# Patient Record
Sex: Male | Born: 1940 | Race: White | Hispanic: No | Marital: Single
Health system: Southern US, Community
[De-identification: ages and names within clinical notes are randomized; demographics above are authoritative.]

---

## 2005-05-16 ENCOUNTER — Emergency Department: Payer: Self-pay | Admitting: Unknown Physician Specialty

## 2010-11-22 ENCOUNTER — Ambulatory Visit: Payer: Self-pay | Admitting: Internal Medicine

## 2011-01-13 ENCOUNTER — Ambulatory Visit: Payer: Self-pay | Admitting: Internal Medicine

## 2011-01-14 ENCOUNTER — Inpatient Hospital Stay: Payer: Self-pay | Admitting: Internal Medicine

## 2012-02-14 ENCOUNTER — Ambulatory Visit: Payer: Self-pay | Admitting: Hematology & Oncology

## 2012-02-27 ENCOUNTER — Inpatient Hospital Stay: Payer: Self-pay | Admitting: Internal Medicine

## 2012-02-27 LAB — TROPONIN I: Troponin-I: <0.02 ng/mL

## 2012-02-27 LAB — CBC
HGB: 13 g/dL (ref 13.0–18.0)
MCH: 38.7 pg — ABNORMAL HIGH (ref 26.0–34.0)
MCV: 114 fL — ABNORMAL HIGH (ref 80–100)
Platelet: 66 10*3/uL — ABNORMAL LOW (ref 150–440)
RBC: 3.35 10*6/uL — ABNORMAL LOW (ref 4.40–5.90)

## 2012-02-27 LAB — COMPREHENSIVE METABOLIC PANEL
Albumin: 3.4 g/dL (ref 3.4–5.0)
Alkaline Phosphatase: 64 U/L (ref 50–136)
BUN: 24 mg/dL — ABNORMAL HIGH (ref 7–18)
Bilirubin,Total: 0.6 mg/dL (ref 0.2–1.0)
Chloride: 86 mmol/L — ABNORMAL LOW (ref 98–107)
Co2: 32 mmol/L (ref 21–32)
Creatinine: 1.07 mg/dL (ref 0.60–1.30)
EGFR (African American): >60
EGFR (Non-African Amer.): >60
Glucose: 407 mg/dL — ABNORMAL HIGH (ref 65–99)
Osmolality: 282 (ref 275–301)
Potassium: 2.8 mmol/L — ABNORMAL LOW (ref 3.5–5.1)
SGOT(AST): 62 U/L — ABNORMAL HIGH (ref 15–37)
SGPT (ALT): 179 U/L — ABNORMAL HIGH

## 2012-02-27 LAB — URINALYSIS, COMPLETE
Bilirubin,UR: NEGATIVE
Glucose,UR: 150 mg/dL (ref 0–75)
Hyaline Cast: 4
Ketone: NEGATIVE
Nitrite: NEGATIVE
Ph: 6 (ref 4.5–8.0)
Protein: NEGATIVE
Specific Gravity: 1.015 (ref 1.003–1.030)
WBC UR: <1 /HPF (ref 0–5)

## 2012-02-27 LAB — APTT: Activated PTT: 24.1 secs (ref 23.6–35.9)

## 2012-02-28 LAB — CBC WITH DIFFERENTIAL/PLATELET
Basophil #: 0 10*3/uL (ref 0.0–0.1)
Eosinophil #: 0 10*3/uL (ref 0.0–0.7)
HCT: 38.5 % — ABNORMAL LOW (ref 40.0–52.0)
HGB: 13.1 g/dL (ref 13.0–18.0)
Lymphocyte %: 31.6 %
MCHC: 34.1 g/dL (ref 32.0–36.0)
Monocyte #: 0.2 x10 3/mm (ref 0.2–1.0)
Monocyte %: 3.8 %
Neutrophil %: 63.8 %
Platelet: 70 10*3/uL — ABNORMAL LOW (ref 150–440)
RBC: 3.34 10*6/uL — ABNORMAL LOW (ref 4.40–5.90)
WBC: 5.9 10*3/uL (ref 3.8–10.6)

## 2012-02-28 LAB — COMPREHENSIVE METABOLIC PANEL
Alkaline Phosphatase: 64 U/L (ref 50–136)
BUN: 25 mg/dL — ABNORMAL HIGH (ref 7–18)
Bilirubin,Total: 0.5 mg/dL (ref 0.2–1.0)
Chloride: 95 mmol/L — ABNORMAL LOW (ref 98–107)
Co2: 32 mmol/L (ref 21–32)
Creatinine: 0.88 mg/dL (ref 0.60–1.30)
EGFR (African American): >60
EGFR (Non-African Amer.): >60
SGOT(AST): 51 U/L — ABNORMAL HIGH (ref 15–37)
SGPT (ALT): 163 U/L — ABNORMAL HIGH
Total Protein: 7.2 g/dL (ref 6.4–8.2)

## 2012-02-28 LAB — HEMOGLOBIN A1C: Hemoglobin A1C: 8.6 % — ABNORMAL HIGH (ref 4.2–6.3)

## 2012-02-29 LAB — HEPATIC FUNCTION PANEL A (ARMC)
Albumin: 3.2 g/dL — ABNORMAL LOW (ref 3.4–5.0)
Alkaline Phosphatase: 62 U/L (ref 50–136)
Bilirubin,Total: 0.5 mg/dL (ref 0.2–1.0)
SGOT(AST): 57 U/L — ABNORMAL HIGH (ref 15–37)
Total Protein: 7.1 g/dL (ref 6.4–8.2)

## 2012-03-01 LAB — CBC WITH DIFFERENTIAL/PLATELET
Basophil %: 0.6 %
Eosinophil %: 0.4 %
HGB: 14 g/dL (ref 13.0–18.0)
Lymphocyte %: 47.8 %
MCHC: 34.8 g/dL (ref 32.0–36.0)
MCV: 111 fL — ABNORMAL HIGH (ref 80–100)
Neutrophil #: 2.9 10*3/uL (ref 1.4–6.5)
Neutrophil %: 47.3 %
RDW: 15.8 % — ABNORMAL HIGH (ref 11.5–14.5)
WBC: 6.2 10*3/uL (ref 3.8–10.6)

## 2012-03-01 LAB — PROTIME-INR: Prothrombin Time: 12.3 secs (ref 11.5–14.7)

## 2012-03-02 LAB — CBC WITH DIFFERENTIAL/PLATELET
Eosinophil #: 0 10*3/uL (ref 0.0–0.7)
Eosinophil %: 0.4 %
HCT: 39.3 % — ABNORMAL LOW (ref 40.0–52.0)
HGB: 13.5 g/dL (ref 13.0–18.0)
Lymphocyte #: 2.3 10*3/uL (ref 1.0–3.6)
Lymphocyte %: 47.5 %
MCH: 38.4 pg — ABNORMAL HIGH (ref 26.0–34.0)
MCHC: 34.2 g/dL (ref 32.0–36.0)
MCV: 112 fL — ABNORMAL HIGH (ref 80–100)
Monocyte %: 3.9 %
Neutrophil #: 2.3 10*3/uL (ref 1.4–6.5)
Platelet: 82 10*3/uL — ABNORMAL LOW (ref 150–440)
RBC: 3.5 10*6/uL — ABNORMAL LOW (ref 4.40–5.90)
RDW: 15.4 % — ABNORMAL HIGH (ref 11.5–14.5)
WBC: 4.8 10*3/uL (ref 3.8–10.6)

## 2012-03-02 LAB — BASIC METABOLIC PANEL
BUN: 13 mg/dL (ref 7–18)
Calcium, Total: 9 mg/dL (ref 8.5–10.1)
Chloride: 90 mmol/L — ABNORMAL LOW (ref 98–107)
EGFR (African American): >60
EGFR (Non-African Amer.): >60
Potassium: 2.4 mmol/L — CL (ref 3.5–5.1)

## 2012-03-02 LAB — PROTIME-INR: Prothrombin Time: 13 secs (ref 11.5–14.7)

## 2012-03-02 LAB — HEPATIC FUNCTION PANEL A (ARMC)
Albumin: 3.2 g/dL — ABNORMAL LOW (ref 3.4–5.0)
Alkaline Phosphatase: 61 U/L (ref 50–136)
Bilirubin, Direct: 0.1 mg/dL (ref 0.00–0.20)
Bilirubin,Total: 0.4 mg/dL (ref 0.2–1.0)
SGPT (ALT): 136 U/L — ABNORMAL HIGH
Total Protein: 7.2 g/dL (ref 6.4–8.2)

## 2012-03-03 LAB — BASIC METABOLIC PANEL WITH GFR
Anion Gap: 13
BUN: 20 mg/dL — ABNORMAL HIGH
Calcium, Total: 9.3 mg/dL
Chloride: 92 mmol/L — ABNORMAL LOW
Co2: 31 mmol/L
Creatinine: 0.79 mg/dL
EGFR (African American): >60
EGFR (Non-African Amer.): >60
Glucose: 210 mg/dL — ABNORMAL HIGH
Osmolality: 281
Potassium: 2.8 mmol/L — ABNORMAL LOW
Sodium: 136 mmol/L

## 2012-03-03 LAB — PLATELET COUNT: Platelet: 99 10*3/uL — ABNORMAL LOW (ref 150–440)

## 2012-03-03 LAB — PROTIME-INR
INR: 1
Prothrombin Time: 13.1 secs (ref 11.5–14.7)

## 2012-03-03 LAB — HEPATIC FUNCTION PANEL A (ARMC)
Albumin: 3.2 g/dL — ABNORMAL LOW
Alkaline Phosphatase: 76 U/L
Bilirubin, Direct: <0.1 mg/dL
Bilirubin,Total: 0.5 mg/dL
SGOT(AST): 135 U/L — ABNORMAL HIGH
SGPT (ALT): 211 U/L — ABNORMAL HIGH
Total Protein: 7.4 g/dL

## 2012-03-15 ENCOUNTER — Ambulatory Visit: Payer: Self-pay | Admitting: Hematology & Oncology

## 2012-06-15 DEATH — deceased

## 2012-11-17 IMAGING — US ABDOMEN ULTRASOUND
1 series · 14 of 25 positions shown · non-contrast
Comparison: none

REASON FOR EXAM: abnormal LFTs
COMMENTS:

[Series 1: abdomen ultrasound · 0.36mm/px · 14 of 83 slices shown]
[im 1/83]
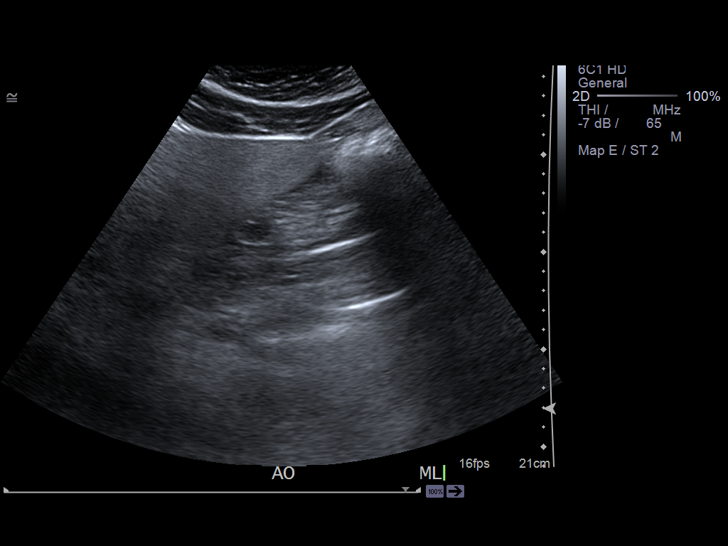
[im 7/83]
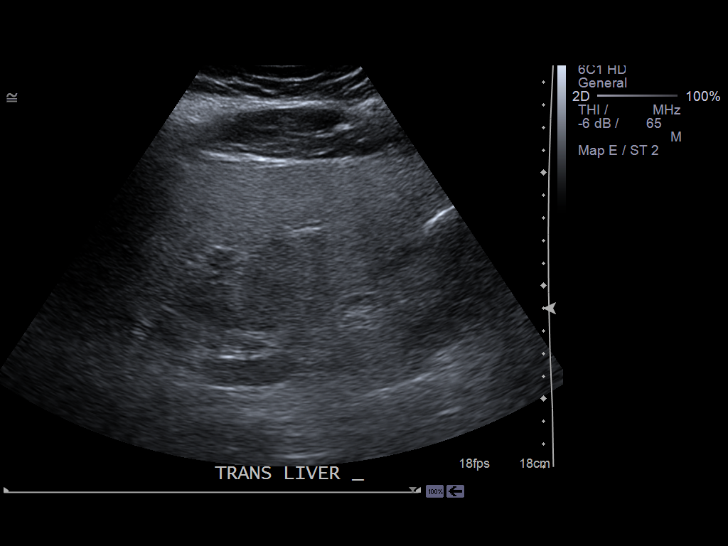
[im 14/83]
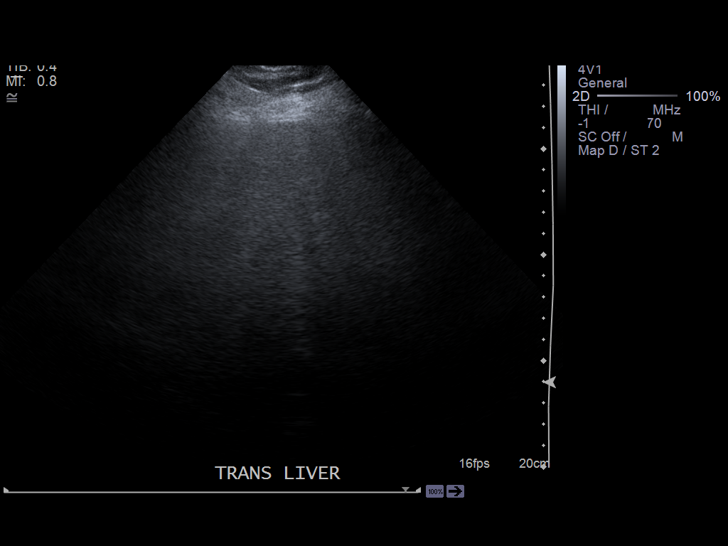
[im 21/83]
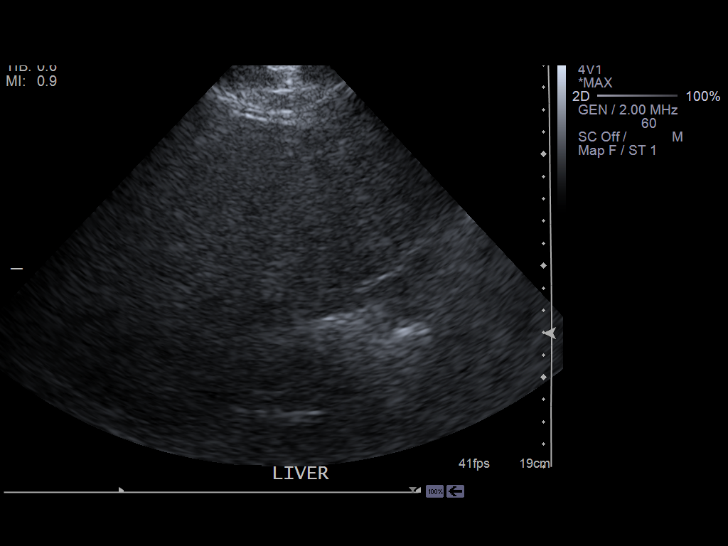
[im 28/83]
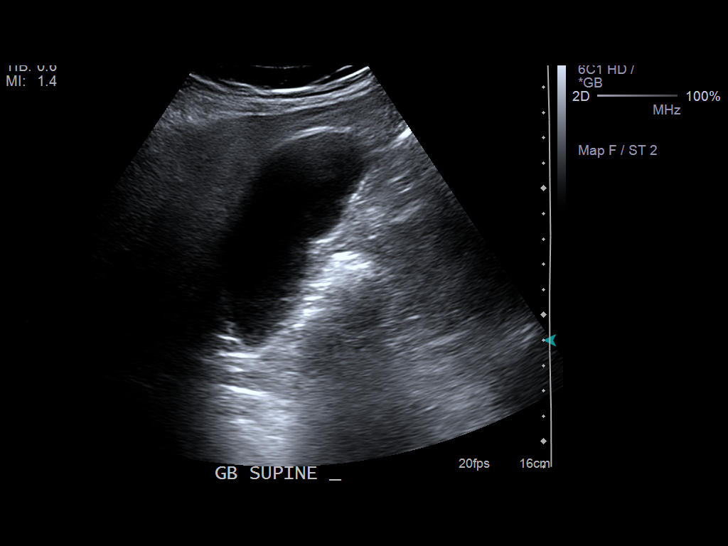
[im 31/83]
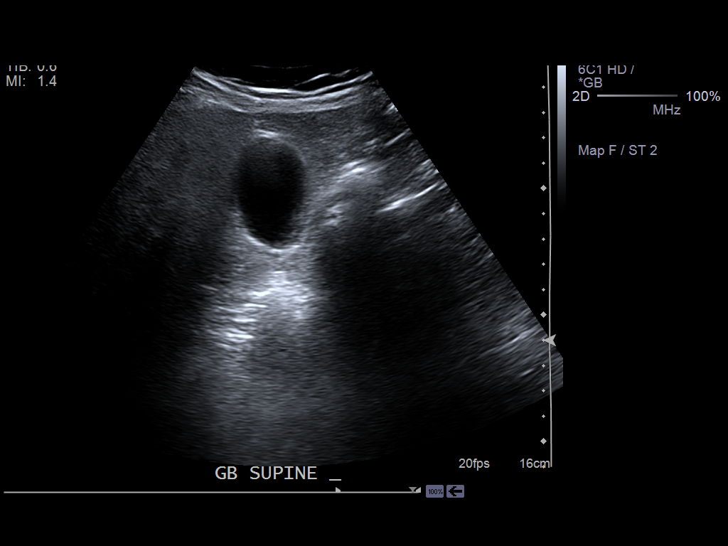
[im 38/83]
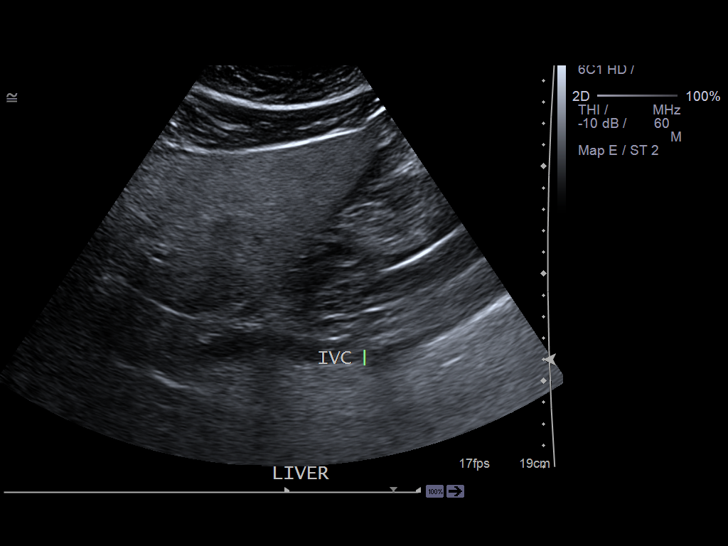
[im 45/83]
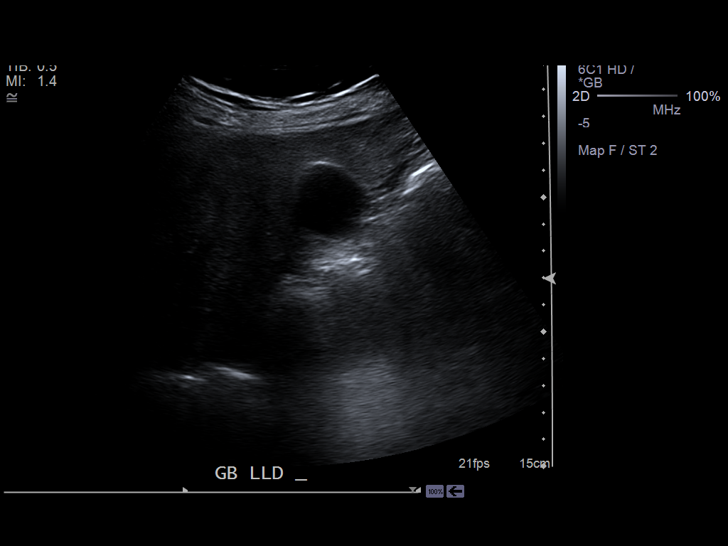
[im 52/83]
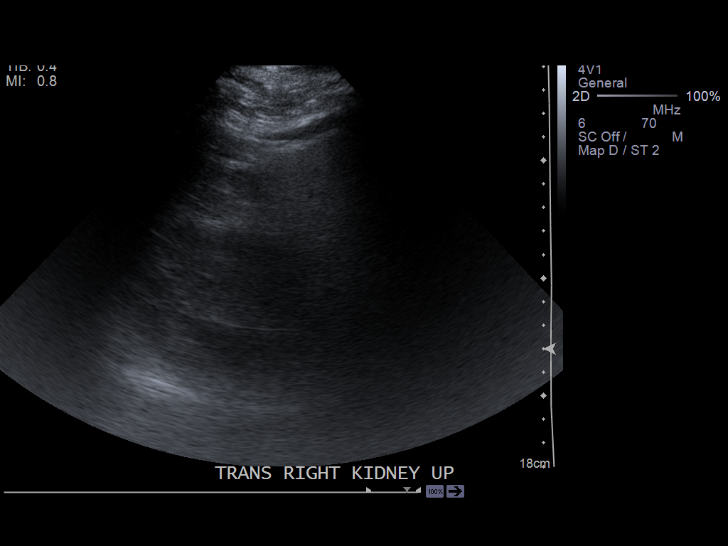
[im 55/83]
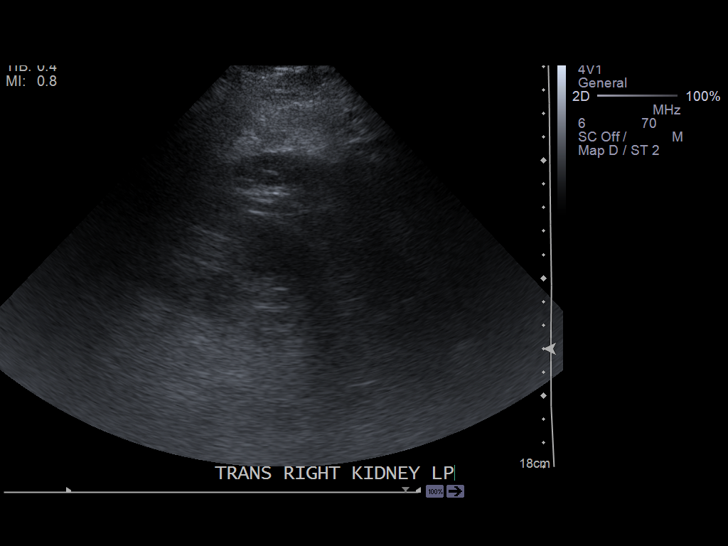
[im 62/83]
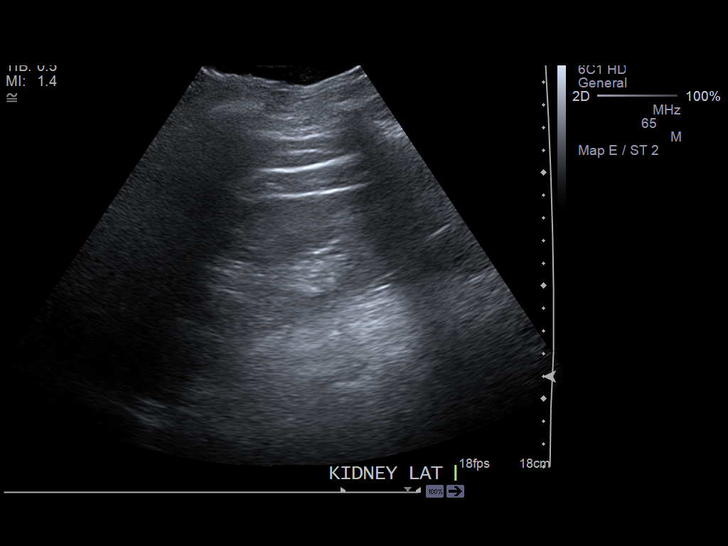
[im 69/83]
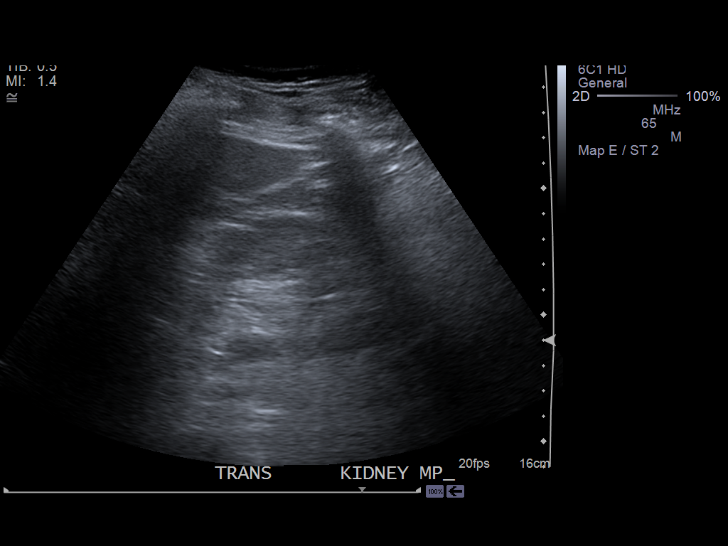
[im 76/83]
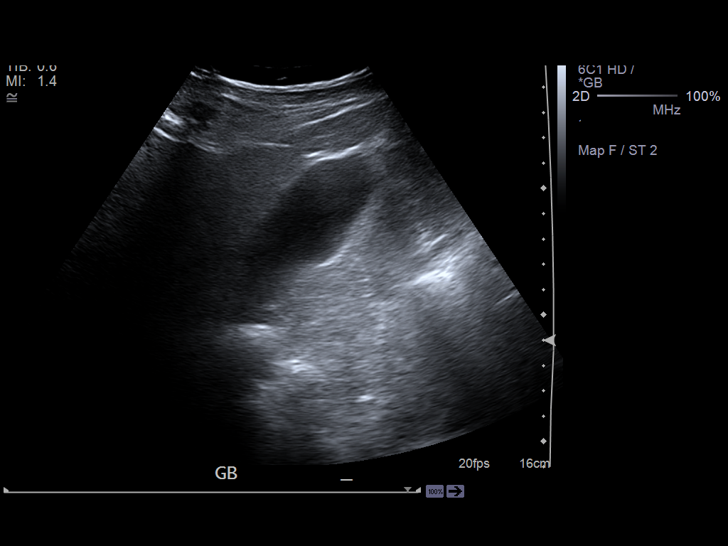
[im 83/83]
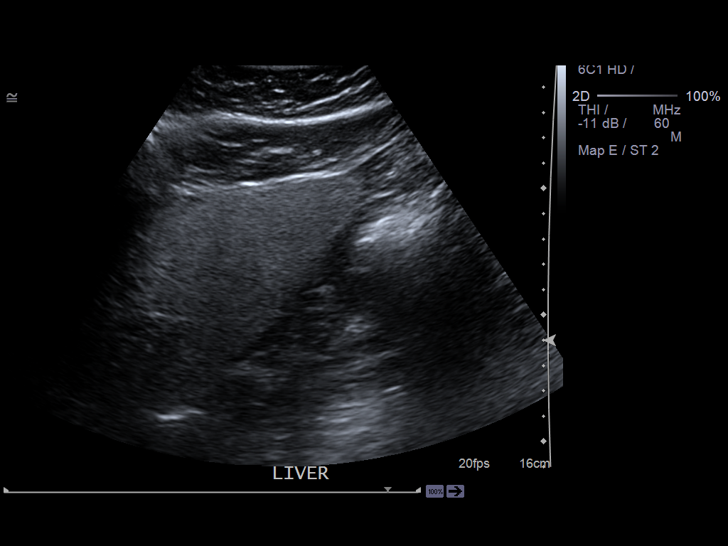

[14 of 25 positions shown; findings below may reference images not displayed]

PROCEDURE:     US  - US ABDOMEN GENERAL SURVEY  - February 29, 2012  [DATE]

RESULT:     The liver exhibits increased echotexture suggesting fatty
infiltrative change. There is no focal mass or ductal dilation. Portal
venous flow is normal in direction toward the liver. The gallbladder is
adequately distended with no evidence of stones, wall thickening, or
pericholecystic fluid. There is no positive sonographic Murphy's sign.

Bowel gas obscured the common bile duct as well as the pancreas and
abdominal aorta. The spleen, inferior vena cava, and kidneys are normal in
appearance.
IMPRESSION: 1. There are changes within the hepatic echotexture consistent with fatty
infiltration. There is no focal mass or ductal dilation.
2. The gallbladder exhibits no evidence of stones.
3. The patient's body habitus as well as the presence of bowel gas limited
evaluation of the pancreas, common bile duct, and abdominal aorta.

[REDACTED]

## 2012-11-18 IMAGING — CT CT CHEST W/ CM
2 series · 15 of 31 positions shown, 19 images · non-contrast
Comparison: none

REASON FOR EXAM: tachycardia, Afib, DVT, ?PE
COMMENTS:

[Series 5: soft tissue · axial · 0.93mm/px · z∈[-666,-618]mm · 2 of 101 slices shown]
[im 8/101  mediastinal]
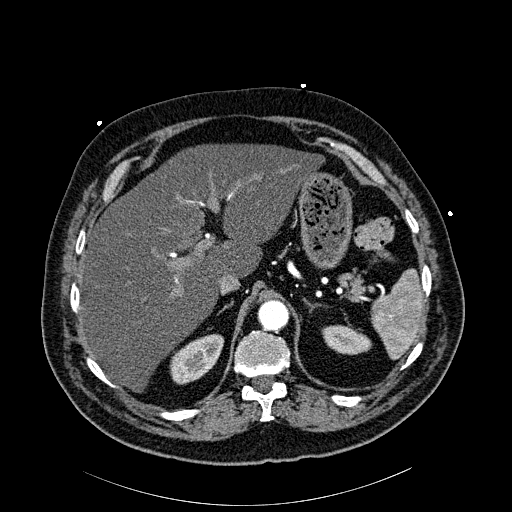
[im 24/101  mediastinal]
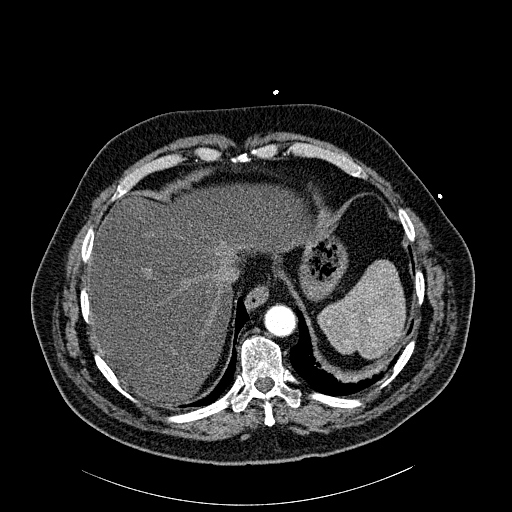

[Series 6: lung windows · axial · 0.93mm/px · z∈[-660,-411]mm · 13 of 99 slices shown, 17 images]
[im 8/99  mediastinal]
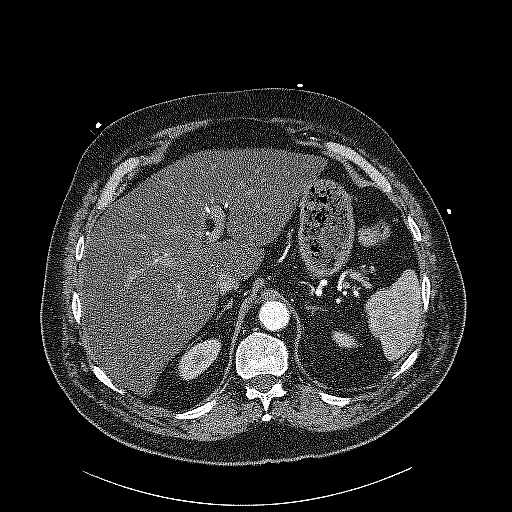
[im 8/99  lung]
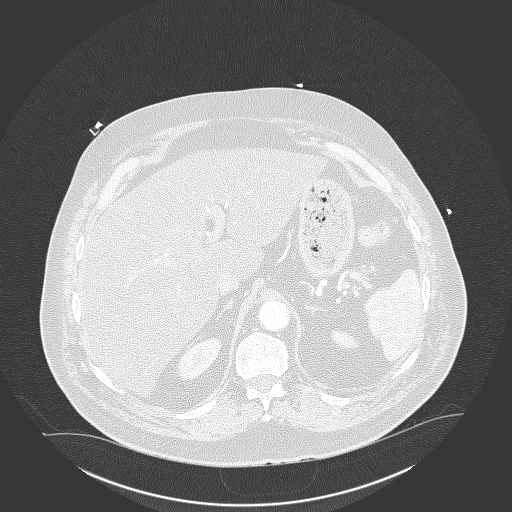
[im 16/99  lung]
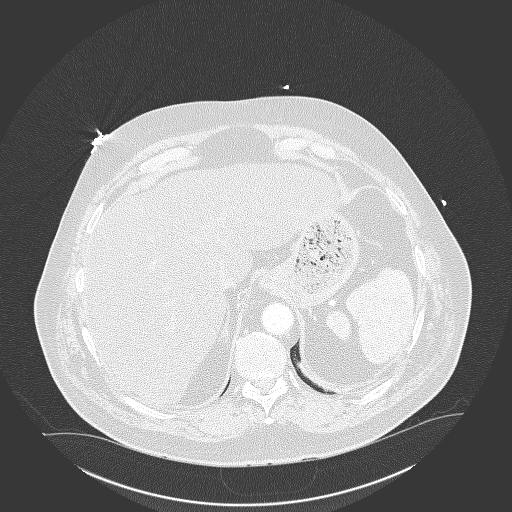
[im 23/99  lung]
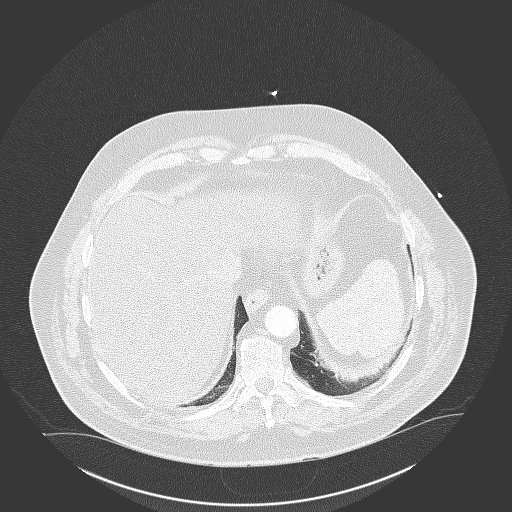
[im 31/99  lung]
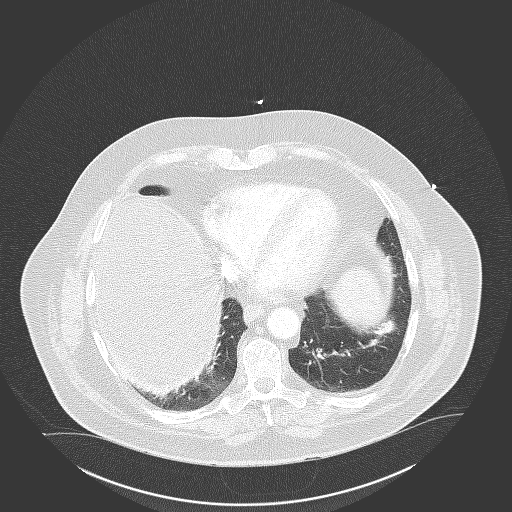
[im 38/99  mediastinal]
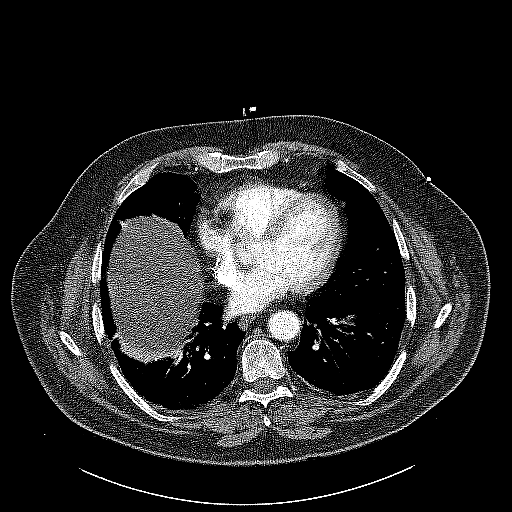
[im 38/99  lung]
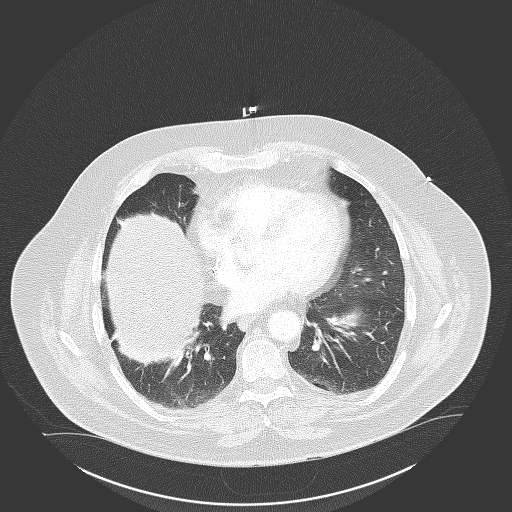
[im 46/99  lung]
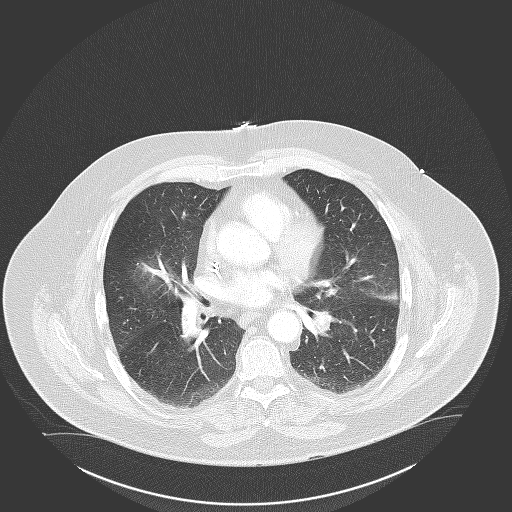
[im 50/99  lung]
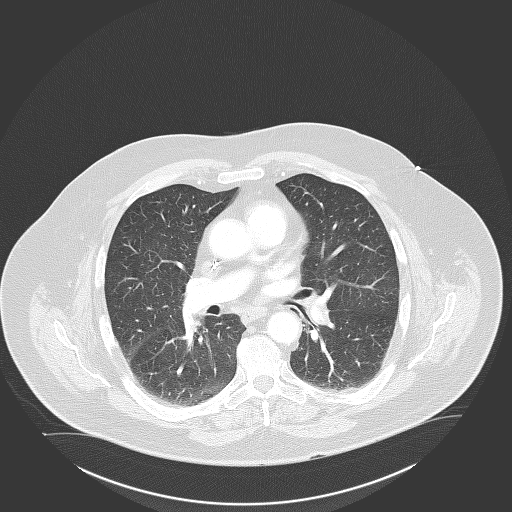
[im 53/99  lung]
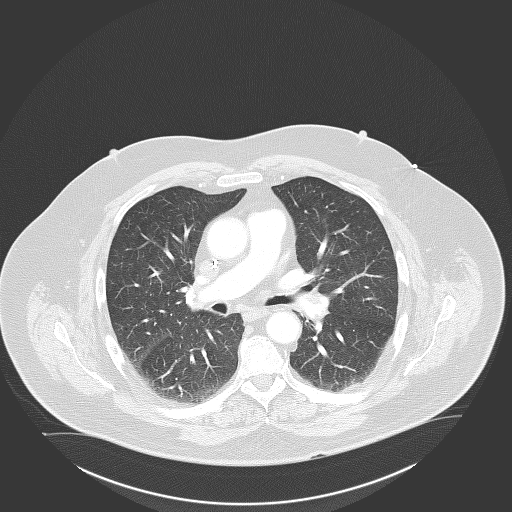
[im 61/99  mediastinal]
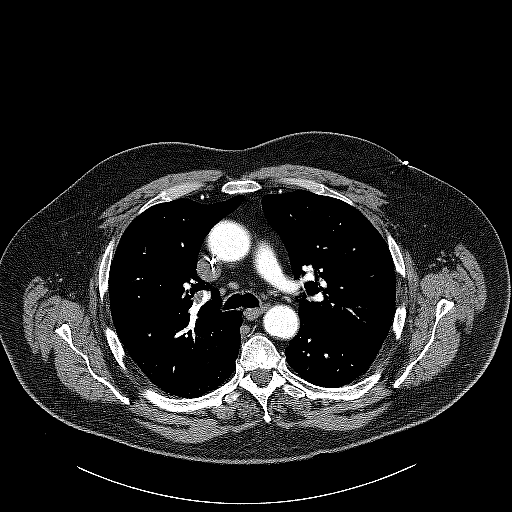
[im 61/99  lung]
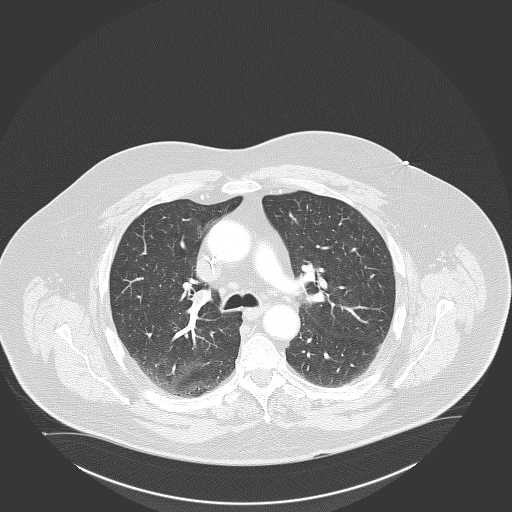
[im 68/99  lung]
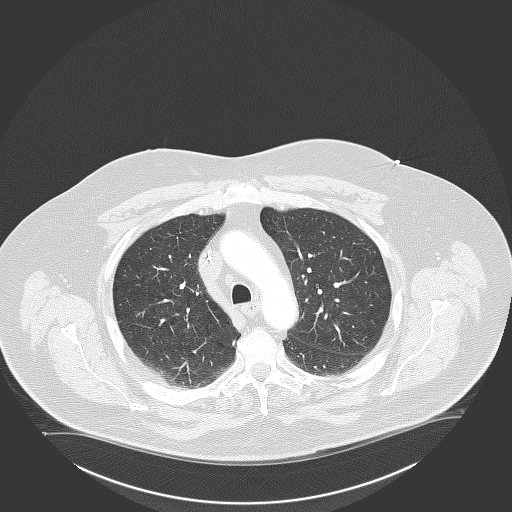
[im 76/99  lung]
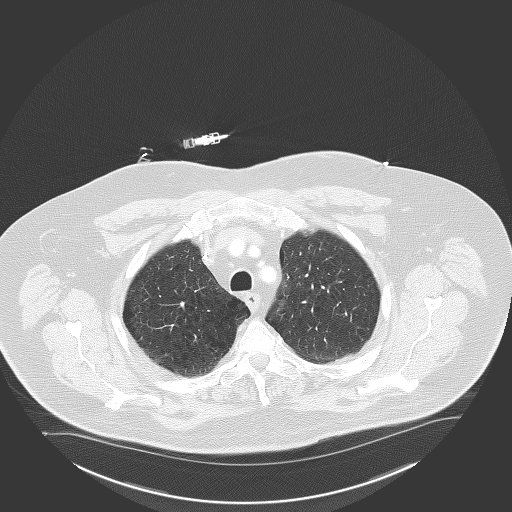
[im 83/99  lung]
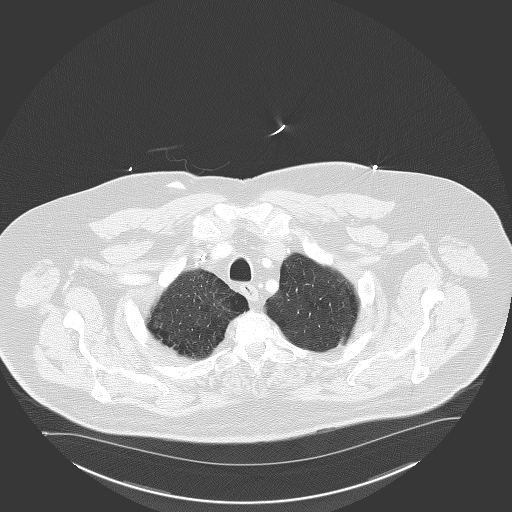
[im 91/99  mediastinal]
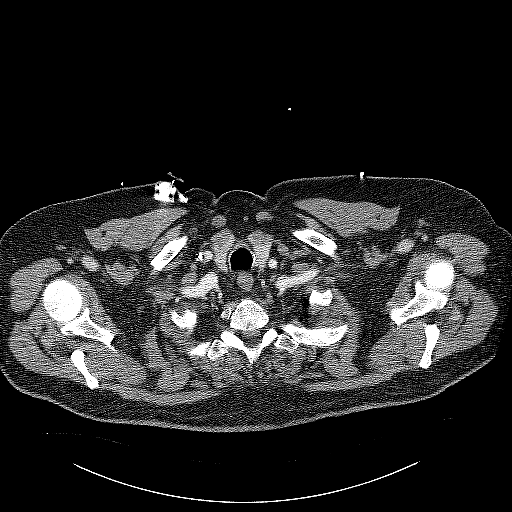
[im 91/99  lung]
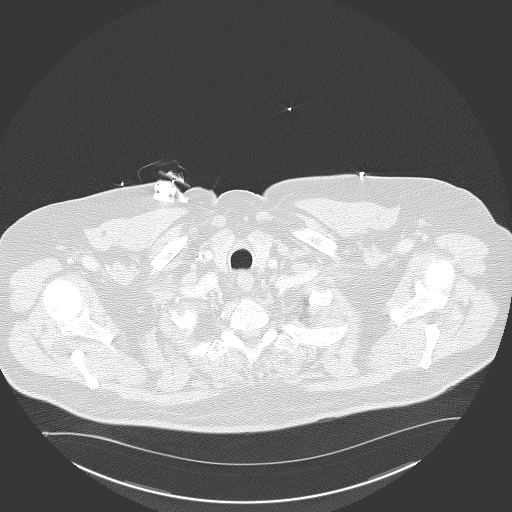

[15 of 31 positions shown; findings below may reference images not displayed]

PROCEDURE:     CT  - CT CHEST (FOR PE) W  - March 01, 2012  [DATE]

RESULT:     Axial CT scanning was performed through the chest with
reconstructions at 3 mm intervals and slice thicknesses. Review of
multiplanar reconstructed images was performed separately on the VIA
monitor.

There are multiple bilateral pulmonary emboli. These are seen in the
proximal branches of the right and left main pulmonary arteries. The cardiac
chambers are normal in size. The caliber of the thoracic aorta is normal. I
see no pleural nor pericardial effusion. No pathologic sized mediastinal or
hilar lymph nodes are evident. The thoracic esophagus does not appear
abnormal.

At lung window settings there are no findings to suggest a pulmonary
infarction. There is minimal bibasilar atelectasis. Within the upper abdomen
the liver exhibits decreased density diffusely consistent with fatty
infiltration. I see no adrenal mass. The thoracic vertebral bodies are
preserved in height.
IMPRESSION: 1. There are multiple bilateral pulmonary emboli. I do not see evidence of
pulmonary infarction.
2. There is no evidence of CHF nor of pneumonia. There is minimal bibasilar
atelectasis posteriorly.
3. I see no mediastinal nor hilar lymphadenopathy nor pleural nor
pericardial effusion.
4. There are fatty infiltrative changes of the liver.

This report was discussed by me by telephone with Dr. Josiel Alejandro at [DATE] p.m. on
01 March, 2012.(*)

## 2015-01-07 NOTE — Discharge Summary (Signed)
PATIENT NAME:  Bobby Curtis, Bobby Curtis#:  [id number] DATE OF BIRTH:  [birth year]  DATE OF ADMISSION:  02/27/2012 DATE OF DISCHARGE:  03/03/2012   PRIMARY CARE PHYSICIAN: Dr. Clayborn BignessKatherine Curtis  PR[facility]: [facility]   REASON FOR ADMISSION: Muscle cramps and pain in both legs.   DISCHARGE DIAGNOSES:  1. 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 2. Bilateral pulmonary emboli, as above due to deep vein thromboses and malignancy.  3. New onset atrial fibrillation with rapid ventricular response, likely from PE.  4. Thrombocytopenia with history of AML.  5. History of AML status post  completed chemotherapy. 6. History of hypertension.  7. History of diabetes mellitus.  8. History of chronic obstructive pulmonary disease.  9. Abnormal liver function tests with abdominal ultrasound revealing fatty liver disease.  10. History of cellulitis. 11. History of congestive heart failure.   CONSULTS:   1. Oncology, Dr. Maryln Curtis   2. Vascular surgery, Dr. Gilda Curtis  DISCHARGE DISPOSITION: Home with Hospice.   DISCHARGE MEDICATIONS:  1. Lovenox 1 mg/kg (120 mg) subq q. 12 hours until INR is therapeutic. 2. Coumadin 5 mg p.o. q. 24 hours. 3. Oxycodone 5 mg p.o. q. 8 hours p.r.n. pain.  4. Metolazone 2.5 mg p.o. daily.  5. Lasix 40 mg p.o. daily.  6. Metformin 1000 mg p.o. b.i.d. with meals. 7. Cardizem CD 120 mg p.o. daily.  8. Potassium chloride 40 mEq p.o. daily. 9. Haloperidol 0.5 mg p.o. b.i.d.  10. Lorazepam 0.5 mg p.o. b.i.d. 11. DuoNebs one dose inhaled t.i.d.  12. Dexamethasone 4 mg p.o. daily.   DISCHARGE CONDITION: Improved, stable.   DISCHARGE ACTIVITY: As tolerated.   DISCHARGE DIET: Low sodium, ADA, low fat, low cholesterol.   DISCHARGE INSTRUCTIONS:  1. Take medications as prescribed.  2. Return to the Emergency Department for recurrence of symptoms or for worsening chest pain or shortness of breath or any for any bleeding complications or  problems.  FOLLOWUP INSTRUCTIONS: 1. Follow up with Bobby Curtis within one week. The patient needs repeat PT/INR in the next two to three days.  Please instruct the patient to stop taking Lovenox once INR is therapeutic and reaches 2. ( Hospice nurse can draw PT/INR at the patient's home with results called or faxed to Bobby Curtis's office). The patient also needs repeat CBC, BMP, and liver function tests per Bobby Curtis in one week.  2. [facility] within 1 to 2 weeks. The patient needs repeat CBC within one week.  3. Resume home Hospice services.   REFERRALS: The patient will [facility] within 1 to 2 weeks for new onset atrial fibrillation.    PROCEDURES:  1. Bilateral lower extremity venous Doppler ultrasound 02/27/2012: On the right there is thrombus that is nearly occlusive in the popliteal vein. On the left there is near-occlusive thrombus seen in the distal aspect of the superficial femoral vein and there is occlusive thrombus in the popliteal vein.  2. Abdominal ultrasound 02/29/2012: Changes in hepatic echotexture consistent with fatty infiltration. No focal mass or ductal dilatation. The gallbladder exhibits no evidence of stones. The patient's body habitus as well as the presence of bowel gas limited evaluation of the pancreas, common bile duct, and abdominal aorta. 3. CT of the chest with PE protocol 03/01/2012: Multiple bilateral pulmonary emboli. No evidence of pulmonary infarction. No evidence of congestive heart failure or pneumonia. Minimal bibasilar  atelectasis posteriorly. No mediastinal or hilar lymphadenopathy. No pleural effusion or pericardial effusion. There are fatty infiltrative changes of the liver.   4. 2-D echocardiogram 02/28/2012: Left ventricular systolic function normal with ejection fraction 50%. The left atrium is mildly dilated, mild to moderate mitral regurgitation, mild  tricuspid regurgitation.   PERTINENT LABORATORY DATA:  EKG on admission: Atrial fibrillation with heart rate 106 beats per minute with nonspecific ST and T wave changes.   INR 1 at the time of admission and 1 on the day of discharge. CBC on admission: WBC 5.6, hemoglobin 13, hematocrit 38.3, platelets 66.  Platelet count 99 on the day of discharge. Troponins were negative on admission. LFTs on admission normal except for AST 62, ALT 179, AST 135, ALT 211 on the day of discharge. Potassium 2.8 on admission and 3 on the day of discharge. Hemoglobin A1c 8.6.   BRIEF HISTORY/HOSPITAL COURSE: The patient is a 74 year old male with past medical history of hypertension, diabetes mellitus, chronic obstructive pulmonary disease, AML status post completed chemotherapy with history of thrombocytopenia who presented to the Emergency Department with complaints of  bilateral leg pain and leg/muscle cramps. Please see dictated admission History and Physical for pertinent details surrounding the onset of this hospitalization. Please see below for further details. 1. Bilateral lower extremity deep vein thromboses: As noted on lower extremity Doppler ultrasound. This is felt to be due to his underlying malignancy as he had no periods of prolonged immobility. He was admitted to the hospital and placed on Lovenox and Coumadin and has responded well. He was advised lifelong anticoagulation as per oncologist's recommendations as  his venous thromboembolism was felt to be due to malignancy. He had atrial fibrillation with RVR and tachycardia, which is new onset, and a CT of the chest was obtained confirming bilateral pulmonary emboli, which is likely the cause of his atrial fibrillation. He has been started on calcium channel blocker for heart rate control and he is on anticoagulation for PE and deep vein thrombosis, which should also provide prophylaxis against stroke. He will be on lifelong anticoagulation. His known CHADS-2  score is 2. He has spontaneously converted to normal sinus rhythm. Otherwise, his atrial fibrillation is asymptomatic and he is without any chest pain, shortness of breath, or heart palpitations. In regards to bilateral pulmonary emboli and deep vein thromboses, as above he will be on lifelong anticoagulation and he has been educated in regards to self- administration of Lovenox and feels comfortable doing so and we will also continue Coumadin. Recommend repeat PT/INR in the next two to three days per the patient's primary care physician.  The patient should be instructed to stop taking Lovenox once his INR is therapeutic and should be given further instructions regarding his Coumadin dose. Vascular surgical consultation was also obtained and Dr. Gilda Crease feels that since the patient has clinically improved there is no definite indication for IVC filter at this time unless he fails anticoagulation, of which there has been no evidence at this time.  2. In regards to atrial fibrillation, the patient has spontaneously converted to sinus rhythm and he will be on Cardizem for heart rate control.  His atrial fibrillation was felt to be due to pulmonary emboli for which he will be on anticoagulation.  His atrial fibrillation seems to be asymptomatic at this time.  He will be referred to cardiology as an outpatient for further management and recommendations. 3. In regards to thrombocytopenia, Lovenox will need to be used cautiously but overall  his platelet count has improved and he has had no bleeding complications or problems during this hospitalization. Thrombocytopenia is likely due to his history of AML. Coumadin should not lower his platelet count.  He will need to continue to follow up with his oncologist at [facility] for further management of AML. 4. AML.  The patient was seen by our oncology team and was recommended to follow up with his oncologist at [facility] upon discharge. 5. In regards to abnormal liver  function tests, the patient was asymptomatic. He was noted to have fatty liver on abdominal ultrasound and chest CT. Hepatotoxins were avoided. A viral hepatitis screen was negative other than prior exposure to hepatitis A virus. The patient has been asymptomatic and denied any abdominal pain, nausea, or vomiting, or any fatigue or malaise. Since he is asymptomatic at this time, his abnormal liver function tests can further be followed and managed and worked up as an outpatient to which the patient was agreeable rather than having this worked up in the inpatient setting, as the patient was given the option but declined. His LFTs have somewhat worsened since admission in regards to his transaminases. Recommend repeat LFTs per the patient's primary care physician within the next week.  6. In regards to chronic obstructive pulmonary disease, this has remained stable and without acute exacerbation.  7. In regards to diabetes mellitus, this patient is poorly controlled with hemoglobin A1c of 8.4, likely due to Decadron. Therefore, the patient's metformin dose has been doubled.  8. In regards to hypertension, blood pressure is at goal at the time of discharge on Cardizem, Lasix, and Zaroxolyn.  9. Hypokalemia, due to diuretics and improved with replacement. The patient will be maintained on potassium supplementation as an outpatient while he will be on Lasix and metolazone and recommend repeat serum potassium check per the patient's primary care physician within one week.  10. With anticoagulation the patient has done extremely well with resolution of his leg pain and leg cramps bilaterally. He is able to ambulate on the day of discharge without any symptoms or complications. On 03/03/2012 the patient was hemodynamically stable and felt to be stable for discharge home with close outpatient followup, to which the patient was agreeable.  TIME SPENT:  Greater than 30 minutes.      ____________________________ Elon Alas, MD [id number] D: 03/06/2012 18:47:32 ET T: 03/07/2012 12:33:05 ET JOB#: [id number]  cc: Elon Alas, MD, <Dictator> Burley Saver, MD [facility]  Dr. Malen Gauze, [facility] Oncology [address] Aloysuis Ribaudo MD ELECTRONICALLY SIGNED 03/16/2012 1:25

## 2015-01-07 NOTE — Consult Note (Signed)
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 He denies any bleeding from any site no bruising no fevers and no respiratory distress, shortness of breath, cough or palpitation. He denies any other complaints and is on no anticoagulants.    Date of Diagnosis 14-Jan-2011    HPI   The patient is a 74 year old male with history of hypertension, diabetes, acute myeloid leukemia, came in because of bilateral leg cramps that started this morning. The patient says that bilateral leg cramps have been going on for a long time but have gotten worse with bil;ateral swelling in the legs. He denies chest pain. No trouble breathing, no cough, no fever. The patient mainly sits in a recliner or is bed bound with very limited mobility. He denies any recent travel. The patient has no drainage from the legs.   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 The patient was found to have acute myeloid leukemia and was transferred to [facility] at that time. He finished the chemotherapy, and he did follow up with his oncologist in February, and he was told he had only two weeks left over, and there is nothing else they can do for it.  patient was admitted yesterday from home hospice in view of bilateral leg DVT.He denies any shortness of breath or respiratory distress.No fever, no bleeding from any sites or bruising.He denies any other complaints.  ALLERGIES: Aspirin.   PFSH:   Family History positive, Father had myocardial infarction. Mother had some cancer, looks like lung cancer, and sisters also had brain cancer.    Additional Past Medical and Surgical History PAST MEDICAL HISTORY: Significant for: 1. Hypertension.   2. Diabetes. 3. History of chronic obstructive pulmonary disease.  4. The patient also has been admitted to a hospital in [place] because of a rash and cellulitis and was given antibiotics. That was in April of this year.   PAST SURGICAL HISTORY: Significant for hernia repair.   Review of Systems:   General denies complaints    Performance Status (ECOG) 2    HEENT no complaints    Lungs no complaints    Cardiac no complaints    GI no complaints    GU no complaints    Musculoskeletal no complaints    Extremities swelling  pain  Bilateral leg DVT    Skin no complaints    Neuro no complaints    Endocrine no complaints    Psych no complaints    Review of Systems   The patient is admitted with bilateral leg DVT bilateral leg swelling with discolaration of both legs.denies any symptoms of bleeding or shortness of breath.of systems was otherwise negative.  NURSING NOTES:  ED Vital Sign Flow Sheet:   14-Jun-13 13:54    Temp Temperature: 97.4    Temp Source: oral    Pulse Pulse: 101    Respirations Respirations: 20    SBP SBP: 115    DBP DBP: 64    Pulse Ox % Pulse Ox %: 94    Source: Oxygen    Pain Scale (0-10) Pain Scale (0-10): Scale:0  NURSING NOTES:  **Vital Signs.:   14-Jun-13 20:43    Vital Signs Type: Admission  Temperature Temperature (F): 98    Celsius: 36.6    Temperature Source: oral    Pulse Pulse: 103    Pulse source: per vital sign device    Respirations Respirations: 20    Systolic BP Systolic BP: 638    Diastolic BP (mmHg) Diastolic BP (mmHg): 51    Mean BP: 72    BP Source: vital sign device    Pulse Ox % Pulse Ox %: 95    Pulse Ox Activity Level: At rest    Oxygen Delivery: 2L    15-Jun-13 00:08    Vital Signs Type: POCT    Nurse Fingerstick (mg/dL) FSBS (fasting range 65-99 mg/dL): 302    Comments/Interventions: Nurse Notified    04:45    Temperature Temperature (F): 97.4    Celsius: 36.3     Temperature Source: Oral    Pulse Pulse: 86    Respirations Respirations: 18    Systolic BP Systolic BP: 466    Diastolic BP (mmHg) Diastolic BP (mmHg): 83    Mean BP: 99    Pulse Ox % Pulse Ox %: 95    Pulse Ox Activity Level: At rest    Oxygen Delivery: Room Air/ 21 %    06:09    Vital Signs Type: POCT    Nurse Fingerstick (mg/dL) FSBS (fasting range 65-99 mg/dL): 226    Comments/Interventions: Nurse Notified    10:37    Pulse Ox % Pulse Ox %: 96    Oxygen Delivery: 1.5 L    14:01    Vital Signs Type: Routine    Temperature Temperature (F): 98.4    Celsius: 36.8    Temperature Source: oral    Pulse Pulse: 86    Respirations Respirations: 18    Systolic BP Systolic BP: 599    Diastolic BP (mmHg) Diastolic BP (mmHg): 82    Mean BP: 102    Pulse Ox % Pulse Ox %: 96    Pulse Ox Activity Level: At rest    Oxygen Delivery: Room Air/ 21 %   Physical Exam:   General well develped ,well nourished in no acute distress    HEENT: normal    Lungs: clear    Cardiac: regular rate, rhythm    Breast: Bilateral normal    Abdomen: soft  nontender    Skin: intact    Extremities: edema  rash    Neuro: AAOx3  cranial nerves intact  No focal deficits    Psych: normal appearance  alert and cooperative  mood calm     leukemia:    COPD:    Aspirin: N/V/Diarrhea      metformin 500 mg oral tablet: 1 tab(s) orally once a day, Active, 0, None   acetaminophen-oxycodone 325 mg-5 mg oral tablet: 1 tab(s) orally every 4 hours, Active, 0, None   furosemide 80 mg oral tablet: 1 tab(s) orally once a day, Active, 0, None   haloperidol 0.5 mg oral tablet: 1 tab(s) orally 2 times a day, Active, 0, None   lorazepam 0.5 mg oral tablet: 1 tab(s) orally 2 times a day, Active, 0, None   potassium chloride: 1 tab(s) orally once a day, Active, 0, None   albuterol-ipratropium 2.5 mg-0.5 mg/3 mL inhalation solution: 3 milliliter(s) inhaled 3 times a day, Active, 0,  None   dexamethasone 4 mg oral tablet: 1 tab(s) orally once a day, Active, 0, None   metolazone 5 mg oral tablet: 1 tab(s) orally once a day, Active, 0,  None  Laboratory Results:  Hepatic:  14-Jun-13 15:59    Bilirubin, Total 0.6   Alkaline Phosphatase 64   SGPT (ALT)  179 (12-78 NOTE: NEW REFERENCE RANGE 08/08/2011)   SGOT (AST)  62   Total Protein, Serum 7.5   Albumin, Serum 3.4  15-Jun-13 04:52    Bilirubin, Total 0.5   Alkaline Phosphatase 64   SGPT (ALT)  163 (12-78 NOTE: NEW REFERENCE RANGE 08/08/2011)   SGOT (AST)  51   Total Protein, Serum 7.2   Albumin, Serum  3.2  Routine Chem:  14-Jun-13 15:59    Glucose, Serum  407   BUN  24   Creatinine (comp) 1.07   Sodium, Serum  130   Potassium, Serum  2.8   Chloride, Serum  86   CO2, Serum 32   Calcium (Total), Serum 9.3   Osmolality (calc) 282   eGFR (African American) >60   eGFR (Non-African American) >60 (eGFR values <93m/min/1.73 m2 may be an indication of chronic kidney disease (CKD). Calculated eGFR is useful in patients with stable renal function. The eGFR calculation will not be reliable in acutely ill patients when serum creatinine is changing rapidly. It is not useful in  patients on dialysis. The eGFR calculation may not be applicable to patients at the low and high extremes of body sizes, pregnant women, and vegetarians.)   Anion Gap 12  15-Jun-13 04:52    Hemoglobin A1c ([facility])  8.6 (The American Diabetes Association recommends that a primary goal of therapy should be <7% and that physicians should reevaluate the treatment regimen in patients with HbA1c values consistently >8%.)   Result Comment LABS - This specimen was collected through an   - indwelling catheter or arterial line.  - A minimum of 562m of blood was wasted prior    - to collecting the sample.  Interpret  - results with caution.  Result(s) reported on 28 Feb 2012 at 05:51AM.   Glucose, Serum  231   BUN  25   Creatinine (comp) 0.88    Sodium, Serum 137   Potassium, Serum  3.3   Chloride, Serum  95   CO2, Serum 32   Calcium (Total), Serum 9.2   Osmolality (calc) 286   eGFR (African American) >60   eGFR (Non-African American) >60 (eGFR values <6031min/1.73 m2 may be an indication of chronic kidney disease (CKD). Calculated eGFR is useful in patients with stable renal function. The eGFR calculation will not be reliable in acutely ill patients when serum creatinine is changing rapidly. It is not useful in  patients on dialysis. The eGFR calculation may not be applicable to patients at the low and high extremes of body sizes, pregnant women, and vegetarians.)   Anion Gap 10   Magnesium, Serum 1.9 (1.8-2.4 THERAPEUTIC RANGE: 4-7 mg/dL TOXIC: > 10 mg/dL  -----------------------)  Cardiac:  14-Jun-13 15:59    CK, Total 48 (Result(s) reported on 27 Feb 2012 at 04:52PM.)   Troponin I < 0.02 (0.00-0.05 0.05 ng/mL or less: NEGATIVE  Repeat testing in 3-6 hrs  if clinically indicated. >0.05 ng/mL: POTENTIAL  MYOCARDIAL INJURY. Repeat  testing in 3-6 hrs if  clinically indicated. NOTE: An increase or decrease  of 30% or more on serial  testing suggests a  clinically important change)  Routine UA:  14-Jun-13 16:11    Color (UA) Yellow   Clarity (UA) Clear   Glucose (UA) 150 mg/dL   Bilirubin (UA) Negative   Ketones (UA) Negative   Specific  Gravity (UA) 1.015   Blood (UA) Negative   pH (UA) 6.0   Protein (UA) Negative   Nitrite (UA) Negative   Leukocyte Esterase (UA) Negative (Result(s) reported on 27 Feb 2012 at 04:37PM.)   RBC (UA) 1 /HPF   WBC (UA) <1 /HPF   Bacteria (UA) TRACE   Epithelial Cells (UA) NONE SEEN   Mucous (UA) PRESENT   Hyaline Cast (UA) 4 /LPF (Result(s) reported on 27 Feb 2012 at 04:37PM.)  Routine Coag:  14-Jun-13 15:59    Prothrombin 13.2   INR 1.0 (INR reference interval applies to patients on anticoagulant therapy. A single INR therapeutic range for coumarins is not optimal for  all indications; however, the suggested range for most indications is 2.0 - 3.0. Exceptions to the INR Reference Range may include: Prosthetic heart valves, acute myocardial infarction, prevention of myocardial infarction, and combinations of aspirin and anticoagulant. The need for a higher or lower target INR must be assessed individually. Reference: The Pharmacology and Management of the Vitamin K  antagonists: the seventh ACCP Conference on Antithrombotic and Thrombolytic Therapy. JQZES.9233 Sept:126 (3suppl): [id number]. A HCT value >55% may artifactually increase the PT.  In one study,  the increase was an average of 25%. Reference:  "Effect on Routine and Special Coagulation Testing Values of Citrate Anticoagulant Adjustment in Patients with High HCT Values." American Journal of Clinical Pathology 2006;126:400-405.)   Activated PTT (APTT) 24.1 (A HCT value >55% may artifactually increase the APTT. In one study, the increase was an average of 19%. Reference: "Effect on Routine and Special Coagulation Testing Values of Citrate Anticoagulant Adjustment in Patients with High HCT Values." American Journal of Clinical Pathology 2006;126:400-405.)  Routine Hem:  14-Jun-13 15:59    WBC (CBC) 5.6   RBC (CBC)  3.35   Hemoglobin (CBC) 13.0   Hematocrit (CBC)  38.3   Platelet Count (CBC)  66 (Result(s) reported on 27 Feb 2012 at 04:51PM.)   MCV  114   MCH  38.7   MCHC 33.9   RDW  15.8  15-Jun-13 04:52    WBC (CBC) 5.9   RBC (CBC)  3.34   Hemoglobin (CBC) 13.1   Hematocrit (CBC)  38.5   Platelet Count (CBC)  70   MCV  115   MCH  39.2   MCHC 34.1   RDW  15.9   Neutrophil % 63.8   Lymphocyte % 31.6   Monocyte % 3.8   Eosinophil % 0.6   Basophil % 0.2   Neutrophil # 3.7   Lymphocyte # 1.9   Monocyte # 0.2   Eosinophil # 0.0   Basophil # 0.0   Radiology Results:  [place]:    14-Jun-13 17:12, [place] Color Flow Doppler Low Extrem Bilat   [place] Color Flow Doppler Low Extrem Bilat     REASON FOR EXAM:    pain swelling  bilat  COMMENTS:       PROCEDURE: [place]  - US DOPPLER LOW EXTR BILATERAL  - Feb 27 2012  5:12PM     RESULT: Both right and left lower extremity deep venous systems were   interrogated using Doppler ultrasound. On the right the common femoral   and superficial femoral veins are patent but the popliteal vein   demonstrates near occlusive thrombus.    On the left the common femoral vein is patent but the superficial femoral   vein distally demonstrates near occlusive thrombus. The popliteal vein   demonstrates occlusive thrombus.    IMPRESSION:  1. On the right there is thrombus that is nearly occlusive in the   popliteal vein.  2. On the left near occlusive thrombus is seen in the distal aspect of   the superficial femoral vein and there is occlusive thrombus in the   popliteal vein.          Verified By: DAVID A. Martinique, M.D., MD   Assessment and Plan:  Impression:   Bilateral leg DVT.:The patient is admitted with bilateral leg DVT.He is on home hospice for diagnosis of acute myeloid leukemia.  He received chemotherapy last year and currently is on no treatment for his diagnosis of leukemia. He continues to be thrombocytopenic but denies any bleeding/ bruising.Currently, he is asymptomatic other than bilateral leg swelling and purplish discoloration of his legs.His DVT  appears to be related to obesity, immobility and underlying diagnosis of malignancy patient would benefit from anticoagulation with agents like arixtra or Lovenox ,most of which have the risk of worsening thrombocytopenia.While on these drugs like Lovenox, his platelet count needs to be maintained more than 50000 with platelet transfusion.Other option would be to consider coumadin after bridging with drug such as Lovenox or heparin drip for a few days followed by continuation of Coumadin as outpatient with monitoring of INR on periodic basis. for IVC filter:The patient is being  evaluated by vascular surgery for IVC filter; if he could not tolerate any of the anticoagulants and has complication such as bleeding; he would he would benefit from IVC filter. workup:The patient would not need any further workup for his hypercoagulability; since he etiology appears to be related to obesity, immobility and underlying diagnosis malignancy.He would need close monitoring of platelet count as well as monitor for any further bleeding while on anticoagulation. consider hematology follow up on discharge as needed.  Electronic Signatures: Murriel Hopper (MD)  (Signed 15-Jun-13 15:46)  Authored: HISTORY OF PRESENT ILLNESS, PFSH, ROS, NURSING NOTES, PE, PAST MEDICAL HISTORY, ALLERGIES, HOME MEDICATIONS, LABS, OTHER RESULTS, ASSESSMENT AND PLAN   Last Updated: 15-Jun-13 15:46 by Murriel Hopper (MD)

## 2015-01-07 NOTE — Consult Note (Signed)
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 He denies any bleeding from any site no bruising no fevers and no respiratory distress, shortness of breath, cough or palpitation.    Date of Diagnosis 14-Jan-2011    HPI   The patient is a 74 year old male with history of hypertension, diabetes, acute myeloid leukemia, came in because of bilateral leg cramps that started this morning. The patient says that bilateral leg cramps have been going on for a long time but have gotten worse with bil;ateral swelling in the legs. He denies chest pain. No trouble breathing, no cough, no fever. The patient mainly sits in a recliner or is bed bound with very limited mobility. He denies any recent travel. The patient has no drainage from the legs.   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 He finished the chemotherapy, and he did follow up with his oncologist in February, and he was told he had only two weeks left over, and there is nothing else they can do for it.  patient was admitted yesterday from home hospice in view of bilateral leg DVT.He denies any shortness of breath or respiratory distress.No fever, no bleeding from any sites or bruising.He denies any other complaints.  ALLERGIES: Aspirin.   PFSH:   Family History positive, Father had myocardial infarction. Mother had some cancer, looks like lung cancer, and sisters also had brain cancer.    Additional Past Medical and Surgical History PAST MEDICAL HISTORY: Significant for: 1. Hypertension.  2. Diabetes. 3. History of chronic obstructive pulmonary disease.  4. The  patient also has been admitted to a hospital in [place] because of a rash and cellulitis and was given antibiotics. That was in April of this year.   PAST SURGICAL HISTORY: Significant for hernia repair.   Review of Systems:   General denies complaints    Performance Status (ECOG) 2    HEENT no complaints    Lungs no complaints    Cardiac no complaints    GI no complaints    GU no complaints    Musculoskeletal no complaints    Extremities swelling  pain  Bilateral leg DVT    Skin no complaints    Neuro no complaints    Endocrine no complaints    Psych no complaints    Review of Systems   The patient is admitted with bilateral leg DVT slight improvement in bilateral leg swelling with marked improvement in discolaration of both legs.denies any symptoms of bleeding or shortness of breath.of systems was otherwise negative.  NURSING NOTES:  ED Vital Sign Flow Sheet:   14-Jun-13 13:54    Temp Temperature: 97.4    Temp Source: oral    Pulse Pulse: 101    Respirations Respirations: 20    SBP SBP: 115    DBP DBP: 64    Pulse Ox % Pulse Ox %: 94    Source: Oxygen    Pain Scale (0-10) Pain Scale (0-10): Scale:0  NURSING NOTES:  **Vital Signs.:   16-Jun-13 04:57    Vital Signs Type: Routine    Temperature Temperature (  F): 97.7    Celsius: 36.5    Temperature Source: oral    Pulse Pulse: 83    Respirations Respirations: 18    Systolic BP Systolic BP: 545    Diastolic BP (mmHg) Diastolic BP (mmHg): 70    Mean BP: 86    BP Source: vital sign device    Pulse Ox % Pulse Ox %: 95    Pulse Ox Activity Level: At rest    Oxygen Delivery: Room Air/ 21 %    05:25    Nurse Fingerstick (mg/dL) FSBS (fasting range 65-99 mg/dL): 143    11:03    Vital Signs Type: POCT    Nurse Fingerstick (mg/dL) FSBS (fasting range 65-99 mg/dL): 294    Comments/Interventions: Nurse Notified    11:17    Pulse Ox % Pulse Ox %: 93    Oxygen Delivery: Room  Air/ 21 %    13:12    Vital Signs Type: Routine    Temperature Temperature (F): 98.4    Celsius: 36.8    Temperature Source: oral    Pulse Pulse: 100    Pulse source: per vital sign device    Respirations Respirations: 18    Systolic BP Systolic BP: 625    Diastolic BP (mmHg) Diastolic BP (mmHg): 88    Mean BP: 101    BP Source: vital sign device    Pulse Ox % Pulse Ox %: 94    Pulse Ox Activity Level: At rest    Oxygen Delivery: Room Air/ 21 %   Physical Exam:   General well develped ,well nourished in no acute distress    HEENT: normal    Lungs: clear    Cardiac: regular rate, rhythm    Breast: Bilateral normal    Abdomen: soft  nontender    Skin: intact    Musculoskeletal: leg swelling improved    Extremities: edema  rash  leg swelling/discoloration improved    Neuro: AAOx3  cranial nerves intact  No focal deficits    Psych: normal appearance  alert and cooperative  mood calm    Aspirin: N/V/Diarrhea    metformin 500 mg oral tablet: 1 tab(s) orally once a day, Active, 0, None   acetaminophen-oxycodone 325 mg-5 mg oral tablet: 1 tab(s) orally every 4 hours, Active, 0, None   furosemide 80 mg oral tablet: 1 tab(s) orally once a day, Active, 0, None   haloperidol 0.5 mg oral tablet: 1 tab(s) orally 2 times a day, Active, 0, None   lorazepam 0.5 mg oral tablet: 1 tab(s) orally 2 times a day, Active, 0, None   potassium chloride: 1 tab(s) orally once a day, Active, 0, None   albuterol-ipratropium 2.5 mg-0.5 mg/3 mL inhalation solution: 3 milliliter(s) inhaled 3 times a day, Active, 0, None   dexamethasone 4 mg oral tablet: 1 tab(s) orally once a day, Active, 0, None   metolazone 5 mg oral tablet: 1 tab(s) orally once a day, Active, 0, None  Laboratory Results:  Hepatic:  14-Jun-13 15:59    Bilirubin, Total 0.6   Alkaline Phosphatase 64   SGPT (ALT)  179 (12-78 NOTE: NEW REFERENCE RANGE 08/08/2011)   SGOT (AST)  62   Total Protein,  Serum 7.5   Albumin, Serum 3.4  15-Jun-13 04:52    Bilirubin, Total 0.5   Alkaline Phosphatase 64   SGPT (ALT)  163 (12-78 NOTE: NEW REFERENCE RANGE 08/08/2011)   SGOT (AST)  51   Total Protein, Serum 7.2  Albumin, Serum  3.2  16-Jun-13 05:16    Bilirubin, Total 0.5   Bilirubin, Direct 0.1 (Result(s) reported on 29 Feb 2012 at 06:18AM.)   Alkaline Phosphatase 62   SGPT (ALT)  143 (12-78 NOTE: NEW REFERENCE RANGE 08/08/2011)   SGOT (AST)  57   Total Protein, Serum 7.1   Albumin, Serum  3.2  Cardiology:  15-Jun-13 09:06    Echo Doppler  Interpretation Summary   Left ventricular systolic function is normal.ef 50% The left atrium  is mildly dilated. The right atrium is mildly dilated. There is mild  to moderate mitral regurgitation. There is mild tricuspid  regurgitation.   PatientHeight: 188 cm   PatientWeight: 109 kg   SystolicPressure: 323 mmHg   DiastolicPressure: 83 mmHg   HeartRate: 86 bpm   BSA: 2.5 m2  Procedure:   A two-dimensional transthoracic echocardiogram with color flow and  Doppler was performed.  Left Ventricle   The left ventricle is normal in size.   Left ventricular systolic function is normal.  Right Ventricle   The right ventricle is normal in size and function.  Atria   The left atrium is mildly dilated.   The right atrium is mildly dilated.  Mitral Valve   The mitral valve leaflets appear thickened, but open well.   There is mild to moderate mitral regurgitation.  Tricuspid Valve   The tricuspid valve is normal in structure and function.   There is mild tricuspid regurgitation.  Aortic Valve   The aortic valve is trileaflet.   No aortic regurgitation is present.  Pulmonic Valve   The pulmonic valve is not well seen, but is grossly normal.  Great Vessels   The aortic root is normal size.  Pericardium/Pleural   No pericardial effusion.  MMode 2D Measurements and Calculations   RVDd: 3.4 cm   IVSd: 0.93 cm   LVIDd: 5.2 cm    LVIDs: 2.9 cm   LVPWd: 0.96 cm   FS: 45 %   EF(Teich): 76 %   Ao root diam: 4.0 cm   LA dimension: 4.4 cm   LVOT diam: 2.5 cm  Doppler Measurements and Calculations   MV E point: 57 cm/sec   MV A point: 77 cm/sec   MV E/A: 0.75    MV dec time: 0.20 sec   Ao V2 max: 127 cm/sec   Ao max PG: 6.0 mmHg   AVA(V,D): 4.4 cm2   LV max PG: 5.0 mmHg   LV V1 max:114 cm/sec   PA V2 max: 107 cm/sec   PA max PG: 5.0 mmHg   RAP systole: 5.0 mmHg  Reading Physician: ,   Sonographer: Dondra Spry Interpreting Physician:   ,  electronically signed on 02-29-2012  08:57:27 Requesting Physician: Lujean Amel  Routine Chem:  14-Jun-13 15:59    BUN  24   Creatinine (comp) 1.07   Sodium, Serum  130   Potassium, Serum  2.8   Chloride, Serum  86   CO2, Serum 32   Calcium (Total), Serum 9.3   Osmolality (calc) 282   eGFR (African American) >60   eGFR (Non-African American) >60 (eGFR values <47mL/min/1.73 m2 may be an indication of chronic kidney disease (CKD). Calculated eGFR is useful in patients with stable renal function. The eGFR calculation will not be reliable in acutely ill patients when serum creatinine is changing rapidly. It is not useful in  patients on dialysis. The eGFR calculation may not be applicable to patients at the low and high extremes  of body sizes, pregnant women, and vegetarians.)   Anion Gap 12  15-Jun-13 04:52    Hemoglobin A1c ([facility])  8.6 (The American Diabetes Association recommends that a primary goal of therapy should be <7% and that physicians should reevaluate the treatment regimen in patients with HbA1c values consistently >8%.)   Result Comment LABS - This specimen was collected through an   - indwelling catheter or arterial line.  - A minimum of 59ms of blood was wasted prior    - to collecting the sample.  Interpret  - results with caution.  Result(s) reported on 28 Feb 2012 at 05:51AM.   Glucose, Serum  231   BUN  25   Creatinine (comp) 0.88    Sodium, Serum 137   Potassium, Serum  3.3   Chloride, Serum  95   CO2, Serum 32   Calcium (Total), Serum 9.2   Osmolality (calc) 286   eGFR (African American) >60   Anion Gap 10   Magnesium, Serum 1.9 (1.8-2.4 THERAPEUTIC RANGE: 4-7 mg/dL TOXIC: > 10 mg/dL  -----------------------)  Cardiac:  14-Jun-13 15:59    CK, Total 48 (Result(s) reported on 27 Feb 2012 at 04:52PM.)   Troponin I < 0.02 (0.00-0.05 0.05 ng/mL or less: NEGATIVE  Repeat testing in 3-6 hrs  if clinically indicated. >0.05 ng/mL: POTENTIAL  MYOCARDIAL INJURY. Repeat  testing in 3-6 hrs if  clinically indicated. NOTE: An increase or decrease  of 30% or more on serial  testing suggests a  clinically important change)  Routine UA:  14-Jun-13 16:11    Color (UA) Yellow   Clarity (UA) Clear   Glucose (UA) 150 mg/dL   Bilirubin (UA) Negative   Ketones (UA) Negative   Specific Gravity (UA) 1.015   Blood (UA) Negative   pH (UA) 6.0   Protein (UA) Negative   Nitrite (UA) Negative   Leukocyte Esterase (UA) Negative (Result(s) reported on 27 Feb 2012 at 04:37PM.)   RBC (UA) 1 /HPF   WBC (UA) <1 /HPF   Bacteria (UA) TRACE   Epithelial Cells (UA) NONE SEEN   Mucous (UA) PRESENT   Hyaline Cast (UA) 4 /LPF (Result(s) reported on 27 Feb 2012 at 04:37PM.)  Routine Coag:  14-Jun-13 15:59    Prothrombin 13.2   INR 1.0 (INR reference interval applies to patients on anticoagulant therapy. A single INR therapeutic range for coumarins is not optimal for all indications; however, the suggested range for most indications is 2.0 - 3.0. Exceptions to the INR Reference Range may include: Prosthetic heart valves, acute myocardial infarction, prevention of myocardial infarction, and combinations of aspirin and anticoagulant. The need for a higher or lower target INR must be assessed individually. Reference: The Pharmacology and Management of the Vitamin K  antagonists: the seventh ACCP Conference on Antithrombotic  and Thrombolytic Therapy. CKXFGH.8299Sept:126 (3suppl): [id number] A HCT value >55% may artifactually increase the PT.  In one study,  the increase was an average of 25%. Reference:  "Effect on Routine and Special Coagulation Testing Values of Citrate Anticoagulant Adjustment in Patients with High HCT Values." American Journal of Clinical Pathology 2006;126:400-405.)   Activated PTT (APTT) 24.1 (A HCT value >55% may artifactually increase the APTT. In one study, the increase was an average of 19%. Reference: "Effect on Routine and Special Coagulation Testing Values of Citrate Anticoagulant Adjustment in Patients with High HCT Values." American Journal of Clinical Pathology 2006;126:400-405.)  Routine Hem:  14-Jun-13 15:59    WBC (CBC) 5.6   RBC (  CBC)  3.35   Hemoglobin (CBC) 13.0   Hematocrit (CBC)  38.3   Platelet Count (CBC)  66 (Result(s) reported on 27 Feb 2012 at 04:51PM.)   MCV  114   MCH  38.7   MCHC 33.9   RDW  15.8  15-Jun-13 04:52    WBC (CBC) 5.9   RBC (CBC)  3.34   Hemoglobin (CBC) 13.1   Hematocrit (CBC)  38.5   Platelet Count (CBC)  70   MCV  115   MCH  39.2   MCHC 34.1   RDW  15.9   Neutrophil % 63.8   Lymphocyte % 31.6   Monocyte % 3.8   Eosinophil % 0.6   Basophil % 0.2   Neutrophil # 3.7   Lymphocyte # 1.9   Monocyte # 0.2   Eosinophil # 0.0   Basophil # 0.0   Assessment and Plan:  Impression:   Bilateral leg DVT.:The patient is admitted with bilateral leg DVT.He is on home hospice for diagnosis of acute myeloid leukemia.  He received chemotherapy last year and currently is on no treatment for his diagnosis of leukemia. He continues to be thrombocytopenic but denies any bleeding/ bruising.Currently, he is asymptomatic other than bilateral leg swelling and purplish discoloration of his legs.His DVT  appears to be related to obesity, immobility and underlying diagnosis of malignancy patient would benefit from anticoagulation with agents like arixtra or  Lovenox ,most of which have the risk of worsening thrombocytopenia.While on these drugs like Lovenox, his platelet count needs to be maintained more than 50000 with platelet transfusion.Other option would be to consider coumadin after bridging with drug such as Lovenox or heparin drip for a few days followed by continuation of Coumadin as outpatient with monitoring of INR on periodic basis with goal low therapeutic at 2-2.5. for IVC filter:The patient is being evaluated by vascular surgery for IVC filter; if he could not tolerate any of the anticoagulants and has complication such as bleeding; he would he would benefit from IVC filter. workup:The patient would not need any further workup for his hypercoagulability; since he etiology appears to be related to obesity, immobility and underlying diagnosis malignancy.He would need close monitoring of platelet count as well as monitor for any further bleeding while on anticoagulation. consider hematology follow up on discharge as needed.  Electronic Signatures: Murriel Hopper (MD)  (Signed 16-Jun-13 15:48)  Authored: HISTORY OF PRESENT ILLNESS, PFSH, ROS, NURSING NOTES, PE, ALLERGIES, HOME MEDICATIONS, LABS, ASSESSMENT AND PLAN   Last Updated: 16-Jun-13 15:48 by Murriel Hopper (MD)

## 2015-01-07 NOTE — Consult Note (Signed)
General Aspect bilateral unprovoked DVT    Present Illness The patient is a 74 year old male with history of hypertension, diabetes, acute myeloid leukemia, came in because of bilateral leg cramps and severe leg swelling that started yesterday morning.  He denies chest pain. No trouble breathing, no cough, no fever. The patient mainly sits in a recliner. He denies any recent travel. The patient has no openm sores or drainage from the legs. No past history of DVT or PE.  PAST MEDICAL HISTORY:  1. Hypertension.  2. Diabetes. 3. History of chronic obstructive pulmonary disease.  4. History of celulitis requiring hsopital admission 5. History of acute myeloid leukemia. 6. The patient was diagnosed with acute myeloid leukemia   Home Medications: Medication Instructions Status  metformin 500 mg oral tablet 1 tab(s) orally once a day Active  acetaminophen-oxycodone 325 mg-5 mg oral tablet 1 tab(s) orally every 4 hours Active  furosemide 80 mg oral tablet 1 tab(s) orally once a day Active  haloperidol 0.5 mg oral tablet 1 tab(s) orally 2 times a day Active  lorazepam 0.5 mg oral tablet 1 tab(s) orally 2 times a day Active  potassium chloride 1 tab(s) orally once a day Active  albuterol-ipratropium 2.5 mg-0.5 mg/3 mL inhalation solution 3 milliliter(s) inhaled 3 times a day Active  dexamethasone 4 mg oral tablet 1 tab(s) orally once a day Active  metolazone 5 mg oral tablet 1 tab(s) orally once a day Active    Aspirin: N/V/Diarrhea  Case History:   Family History Non-Contributory    Social History negative tobacco, negative ETOH, negative Illicit drugs   Review of Systems:   Fever/Chills No    Cough No    Sputum No    Abdominal Pain No    Diarrhea No    Constipation No    Nausea/Vomiting No    SOB/DOE No    Chest Pain No    Telemetry Reviewed NSR    Dysuria No   Physical Exam:   GEN well developed, obese    HEENT pale conjunctivae, hearing intact to voice, dry  oral mucosa    NECK supple  trachea midline    RESP normal resp effort  no use of accessory muscles    CARD regular rate  LE edema present  no JVD    ABD denies tenderness  firm no fluid wave noted    EXTR positive cyanosis/clubbing, trace edema 2+ PT pulses bilaterally    SKIN No rashes, No ulcers, skin turgor decreased    NEURO cranial nerves intact, follows commands, motor/sensory function intact    PSYCH alert, good insight   Nursing/Ancillary Notes: **Vital Signs.:   15-Jun-13 04:45   Temperature Temperature (F) 97.4   Celsius 36.3   Temperature Source Oral   Pulse Pulse 86   Respirations Respirations 18   Systolic BP Systolic BP 007   Diastolic BP (mmHg) Diastolic BP (mmHg) 83   Mean BP 99   Pulse Ox % Pulse Ox % 95   Pulse Ox Activity Level  At rest   Oxygen Delivery Room Air/ 21 %   Hepatic:  14-Jun-13 15:59    Bilirubin, Total 0.6   Alkaline Phosphatase 64   SGPT (ALT)  179 (12-78 NOTE: NEW REFERENCE RANGE 08/08/2011)   SGOT (AST)  62   Total Protein, Serum 7.5   Albumin, Serum 3.4  15-Jun-13 04:52    Bilirubin, Total 0.5   Alkaline Phosphatase 64   SGPT (ALT)  163 (12-78 NOTE: NEW  REFERENCE RANGE 08/08/2011)   SGOT (AST)  51   Total Protein, Serum 7.2   Albumin, Serum  3.2  Routine Chem:  14-Jun-13 15:59    Glucose, Serum  407   BUN  24   Creatinine (comp) 1.07   Sodium, Serum  130   Potassium, Serum  2.8   Chloride, Serum  86   CO2, Serum 32   Calcium (Total), Serum 9.3   Osmolality (calc) 282   eGFR (African American) >60   eGFR (Non-African American) >60 (eGFR values <92m/min/1.73 m2 may be an indication of chronic kidney disease (CKD). Calculated eGFR is useful in patients with stable renal function. The eGFR calculation will not be reliable in acutely ill patients when serum creatinine is changing rapidly. It is not useful in  patients on dialysis. The eGFR calculation may not be applicable to patients at the low and high extremes  of body sizes, pregnant women, and vegetarians.)   Anion Gap 12  15-Jun-13 04:52    Hemoglobin A1c ([facility])  8.6 (The American Diabetes Association recommends that a primary goal of therapy should be <7% and that physicians should reevaluate the treatment regimen in patients with HbA1c values consistently >8%.)   Result Comment LABS - This specimen was collected through an   - indwelling catheter or arterial line.  - A minimum of 510m of blood was wasted prior    - to collecting the sample.  Interpret  - results with caution.  Result(s) reported on 28 Feb 2012 at 05:51AM.   Glucose, Serum  231   BUN  25   Creatinine (comp) 0.88   Sodium, Serum 137   Potassium, Serum  3.3   Chloride, Serum  95   CO2, Serum 32   Calcium (Total), Serum 9.2   Osmolality (calc) 286   eGFR (African American) >60   eGFR (Non-African American) >60 (eGFR values <6037min/1.73 m2 may be an indication of chronic kidney disease (CKD). Calculated eGFR is useful in patients with stable renal function. The eGFR calculation will not be reliable in acutely ill patients when serum creatinine is changing rapidly. It is not useful in  patients on dialysis. The eGFR calculation may not be applicable to patients at the low and high extremes of body sizes, pregnant women, and vegetarians.)   Anion Gap 10   Magnesium, Serum 1.9 (1.8-2.4 THERAPEUTIC RANGE: 4-7 mg/dL TOXIC: > 10 mg/dL  -----------------------)  Cardiac:  14-Jun-13 15:59    CK, Total 48 (Result(s) reported on 27 Feb 2012 at 04:52PM.)   Troponin I < 0.02 (0.00-0.05 0.05 ng/mL or less: NEGATIVE  Repeat testing in 3-6 hrs  if clinically indicated. >0.05 ng/mL: POTENTIAL  MYOCARDIAL INJURY. Repeat  testing in 3-6 hrs if  clinically indicated. NOTE: An increase or decrease  of 30% or more on serial  testing suggests a  clinically important change)  Routine UA:  14-Jun-13 16:11    Color (UA) Yellow   Clarity (UA) Clear   Glucose (UA) 150 mg/dL    Bilirubin (UA) Negative   Ketones (UA) Negative   Specific Gravity (UA) 1.015   Blood (UA) Negative   pH (UA) 6.0   Protein (UA) Negative   Nitrite (UA) Negative   Leukocyte Esterase (UA) Negative (Result(s) reported on 27 Feb 2012 at 04:37PM.)   RBC (UA) 1 /HPF   WBC (UA) <1 /HPF   Bacteria (UA) TRACE   Epithelial Cells (UA) NONE SEEN   Mucous (UA) PRESENT   Hyaline Cast (UA)  4 /LPF (Result(s) reported on 27 Feb 2012 at 04:37PM.)  Routine Coag:  14-Jun-13 15:59    Prothrombin 13.2   INR 1.0 (INR reference interval applies to patients on anticoagulant therapy. A single INR therapeutic range for coumarins is not optimal for all indications; however, the suggested range for most indications is 2.0 - 3.0. Exceptions to the INR Reference Range may include: Prosthetic heart valves, acute myocardial infarction, prevention of myocardial infarction, and combinations of aspirin and anticoagulant. The need for a higher or lower target INR must be assessed individually. Reference: The Pharmacology and Management of the Vitamin K  antagonists: the seventh ACCP Conference on Antithrombotic and Thrombolytic Therapy. DGUYQ.0347 Sept:126 (3suppl): [id number]. A HCT value >55% may artifactually increase the PT.  In one study,  the increase was an average of 25%. Reference:  "Effect on Routine and Special Coagulation Testing Values of Citrate Anticoagulant Adjustment in Patients with High HCT Values." American Journal of Clinical Pathology 2006;126:400-405.)   Activated PTT (APTT) 24.1 (A HCT value >55% may artifactually increase the APTT. In one study, the increase was an average of 19%. Reference: "Effect on Routine and Special Coagulation Testing Values of Citrate Anticoagulant Adjustment in Patients with High HCT Values." American Journal of Clinical Pathology 2006;126:400-405.)  Routine Hem:  14-Jun-13 15:59    WBC (CBC) 5.6   RBC (CBC)  3.35   Hemoglobin (CBC) 13.0   Hematocrit (CBC)   38.3   Platelet Count (CBC)  66 (Result(s) reported on 27 Feb 2012 at 04:51PM.)   MCV  114   MCH  38.7   MCHC 33.9   RDW  15.8  15-Jun-13 04:52    WBC (CBC) 5.9   RBC (CBC)  3.34   Hemoglobin (CBC) 13.1   Hematocrit (CBC)  38.5   Platelet Count (CBC)  70   MCV  115   MCH  39.2   MCHC 34.1   RDW  15.9   Neutrophil % 63.8   Lymphocyte % 31.6   Monocyte % 3.8   Eosinophil % 0.6   Basophil % 0.2   Neutrophil # 3.7   Lymphocyte # 1.9   Monocyte # 0.2   Eosinophil # 0.0   Basophil # 0.0     Impression 1.  DVT bialteral lower extremities          I would initiate anticoagulation if feasible.  I recognize that his platelet count is on the low side but strong consideration to the hypercoagulable state and the high risk of caval thrombosis would indicate that a trial of anticoagulation is warranted.  If he were to fail anticoagulation or his platelet count contiue to decline then a filter would be indicated. 2.  Thrombocytopenia           as a result of his leukemia           count is higher today would not transfuse           oncology following 3.  AML             Oncology is on consult             changes per that service. 4.  Diabetes             ADA diet             sliding scale as needed 5.  COPD             continue inhaler 6.  Hypertension  lasix per the [facility] [address] service but concern is dehydration will significantly worsen the hypercoagulable state    Plan level 4   Electronic Signatures: Hortencia Pilar (MD)  (Signed 15-Jun-13 16:02)  Authored: General Aspect/Present Illness, Home Medications, Allergies, History and Physical Exam, Vital Signs, Labs, Impression/Plan   Last Updated: 15-Jun-13 16:02 by Hortencia Pilar (MD)

## 2015-01-07 NOTE — H&P (Signed)
PATIENT NAME:  Bobby Curtis, Bobby Curtis MR#:  [id number] DATE OF BIRTH:  1941/08/01  DATE OF ADMISSION:  02/27/2012  PRIMARY CARE PHYSICIAN: Burley Saver, MD   ONCOLOGIST: Dr. Malen Gauze  ER REFERRING PHYSICIAN: Joseph Art, MD     CHIEF COMPLAINT: Muscle cramps in the legs.   HISTORY OF PRESENT ILLNESS: The patient is a 74 year old male with history of hypertension, diabetes, acute myeloid leukemia, came in because of bilateral leg cramps that started this morning. The patient says that bilateral leg cramps have been going on for a long time but have gotten worse this morning, and he has some swelling in the legs. He denies chest pain. No trouble breathing, no cough, no fever. The patient mainly sits in a recliner. He denies any recent travel. The patient has no drainage from the legs.   PAST MEDICAL HISTORY: Significant for: 1. Hypertension.  2. Diabetes. 3. History of chronic obstructive pulmonary disease.  4. The patient also has been admitted to a hospital in [place] because of a rash and cellulitis and was given antibiotics. That was in April of this year.  5. History of acute myeloid leukemia. 6. 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 The patient was found to have acute myeloid leukemia and was transferred to [facility] at that time. He finished the chemotherapy, and he did follow up with his oncologist in February, and he was told he had only two weeks left over, and there is nothing else they can do for it.    ALLERGIES: Aspirin.   FAMILY HISTORY: Father had myocardial infarction. Mother had some cancer, looks like lung cancer, and sisters also had brain cancer.   SOCIAL HISTORY: The patient retired, with three children. He still smokes and does not want to quit. He smokes about 1 pack per day. No alcohol.   PAST SURGICAL HISTORY: Significant for hernia repair.  MEDICATIONS:   1. Acetaminophen with oxycodone 325/5, 1 tablet every 4 hours as needed for back pain.  2. Combivent inhaler every 6 hours. 3. Decadron 4 mg p.o. daily.  4. Lasix 80 mg daily.  5. Haldol 0.5 mg p.o. b.i.d.  6. Ativan 0.5 mg p.o. b.i.d.  7. Metformin 500 mg p.o. daily.  8. Metolazone 5 mg p.o. daily.  9. KCL 1 tablet p.o. daily.   REVIEW OF SYSTEMS: CONSTITUTIONAL: No fever. No fatigue. EYES: No blurred vision. ENT: No tinnitus. No epistaxis. No difficulty swallowing. RESPIRATORY: No cough. No wheezing. CARDIOVASCULAR: No chest pain. No orthopnea. Has some pedal edema, no palpitations. GASTROINTESTINAL: No nausea. No vomiting. No loss of appetite. No abdominal pain. GENITOURINARY: No dysuria. ENDOCRINE: No polyuria or nocturia. INTEGUMENT: No skin rashes. MUSCULOSKELETAL: Has leg cramps bilaterally and swelling in both legs. NEUROLOGICAL: No numbness or weakness. PSYCHIATRIC: No anxiety or insomnia.   PHYSICAL EXAMINATION:  GENERAL: This is an obese male in the ER,  answering questions appropriately, not in distress. He is alert, awake, oriented, answering questions appropriately.   VITAL SIGNS: Temperature 97.4, pulse 101, respirations 20, blood pressure 115/64, saturations 94% on 1 liter.   HEENT: Head: Atraumatic, normocephalic. Eyes: Pupils are equal reacting to light. Extraocular movements intact. ENT: No tympanic membrane congestion. No turbinate hypertrophy. No oropharyngeal erythema.   NECK: Normal range of motion. No JVD. No carotid bruit.   CARDIOVASCULAR: S1 and S2, irregularly irregular. He is in atrial fibrillation.  LUNGS: Clear to auscultation. No  wheeze. No rales.   ABDOMEN: Obese, nontender, nondistended. Bowel sounds are present.   EXTREMITIES: The patient has no Homans sign. Bilateral  extremity edema up to knees, pitting type, no evidence of cellulitis. Dorsalis pedis pulses are present bilaterally.   SKIN: Warm and dry.    NEUROLOGICAL: Cranial nerves II through  XII are intact. Power 5 out of 5 in upper and lower extremities. Sensation intact. Deep tendon reflexes 2+ bilaterally.   PSYCHIATRIC: Mood and affect are within normal limits.   LABORATORY, DIAGNOSTIC AND RADIOLOGICAL DATA:  Lower extremity ultrasound bilaterally showed on the right side there is nearly occlusive thrombus in the popliteal vein and on the left near occlusive thrombus in the distal aspect of the superficial femoral; and there is an occlusive thrombus in the popliteal vein.  Urinalysis : Yellow-colored clear urine.  WBC 5.6, hemoglobin 13, hematocrit 38.3, platelets 66.  Electrolytes: Sodium 130, potassium 2.8, chloride 86, bicarbonate 32, BUN 24, creatinine 1.07, glucose 407, ALT 179, alkaline phosphatase 64.  Troponin less than 0.02.  CK total 48.  EKG: Atrial fibrillation with RVR, 105 beats, no ST-T changes.  Stool guaiac is negative.   ASSESSMENT AND PLAN: The patient is a 74 year old male with: 1. 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 Gilda CreaseSchnier from Vascular Surgery.  He recommended in a patient with hypercoagulable state even if he put in an IVC filter he is going to have thrombosis. The patient was given a dose of Lovenox, and I will consult Oncology regarding his anticoagulation options. I called Dr. Doylene Canninghoksi.   2. We will get hypercoagulable work-up with protein C, protein S, and also obtain records from his oncologist[facility].  3. Hyperglycemia, diabetes type 2 uncontrolled: Likely secondary to steroids that he is on, Decadron. Continue metformin sliding-scale coverage. He is not in diabetic ketoacidosis and no anion gap at this time. The patient's renal function is normal.  4. Hypertension: The patient is on Lasix and metolazone. Get an echocardiogram and also continue his metolazone and Lasix.  5. New onset atrial fibrillation with RVR: Continue beta blockers and check TSH and echocardiogram.   6. Hypokalemia secondary to Lasix and metolazone: Replace it and recheck in the morning.   I discussed the plan with the patient and the patient's son. The patient is followed with [facility], but the patient says that he wants to revoke his DO NOT RESUSCITATE and we will involve Palliative Care as well.   TIME SPENT: About 60 minutes.  ____________________________ Katha HammingSnehalatha Mareo Portilla, MD [id number] D: 02/27/2012 18:23:08 ET T: 02/27/2012 18:52:15 ET JOB#: [id number]  cc: Katha HammingSnehalatha Saprina Chuong, MD, <Dictator> Burley SaverL. Katherine Bliss, MD Katha HammingSNEHALATHA Amya Hlad MD ELECTRONICALLY SIGNED 03/22/2012 10:23
